# Patient Record
Sex: Male | Born: 1960 | Race: Black or African American | Hispanic: No | Marital: Single
Health system: Southern US, Community
[De-identification: ages and names within clinical notes are randomized; demographics above are authoritative.]

---

## 2004-10-02 ENCOUNTER — Inpatient Hospital Stay (HOSPITAL_COMMUNITY): Admission: EM | Admit: 2004-10-02 | Discharge: 2004-10-09 | Payer: Self-pay | Admitting: Emergency Medicine

## 2004-10-02 ENCOUNTER — Ambulatory Visit: Payer: Self-pay | Admitting: Internal Medicine

## 2004-10-05 ENCOUNTER — Ambulatory Visit: Payer: Self-pay | Admitting: Oncology

## 2007-01-25 IMAGING — CR DG ABD PORTABLE 1V
1 series · 1 of 1 positions shown · non-contrast
Comparison: none

CLINICAL DATA: Sickle cell disease.  
 PORTABLE ABDOMEN - 1 VIEW:
 Single view of the abdomen shows distention of small bowel loops measuring up to 4.2 cm in the left upper quadrant.  Stool and gas are seen in the right colon.

[view not recorded]
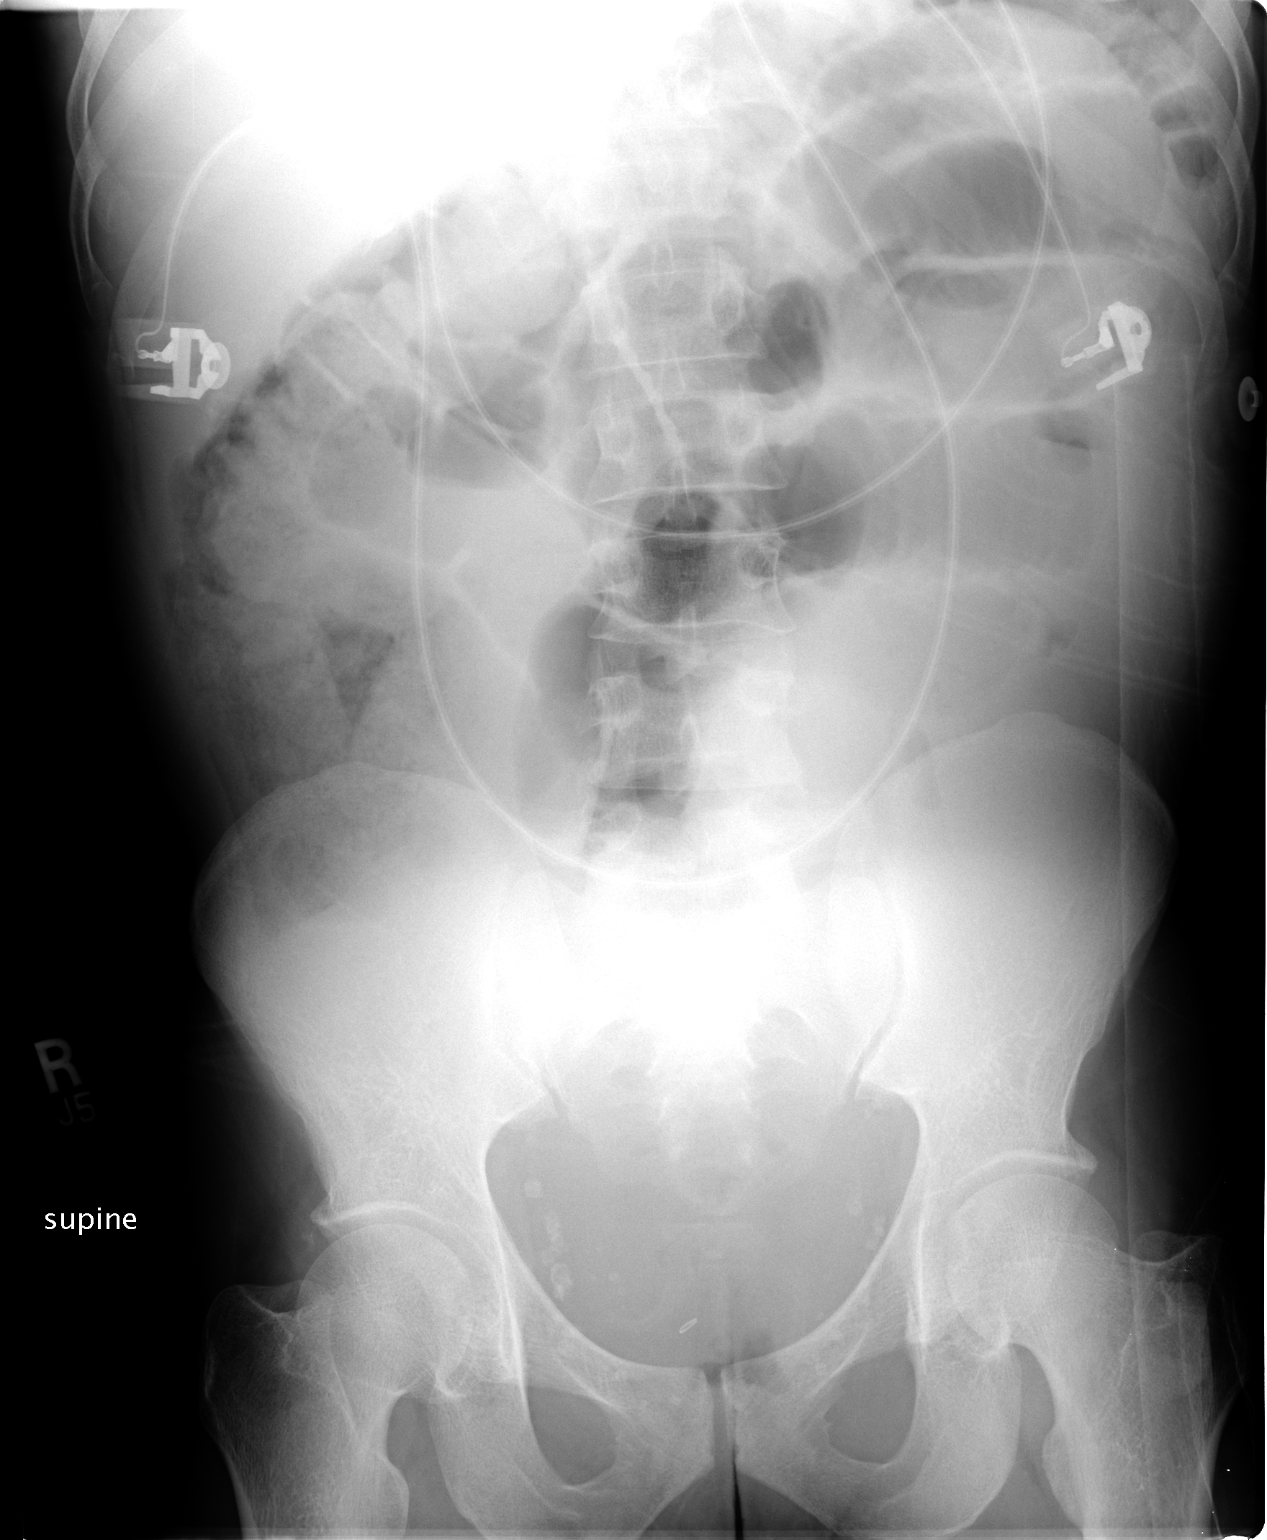

[1 of 1 positions shown; findings below may reference images not displayed]

IMPRESSION: Gaseous distention of small bowel with stool and gas seen in the colon.  Partial small bowel obstruction not excluded.

## 2007-01-26 IMAGING — CR DG CHEST 2V
2 series · 2 of 2 positions shown · non-contrast
Comparison: 10/04/2004.

CLINICAL DATA: Shortness of breath. Sickle cell disease.

CHEST - 2 VIEW

[w chest pa]
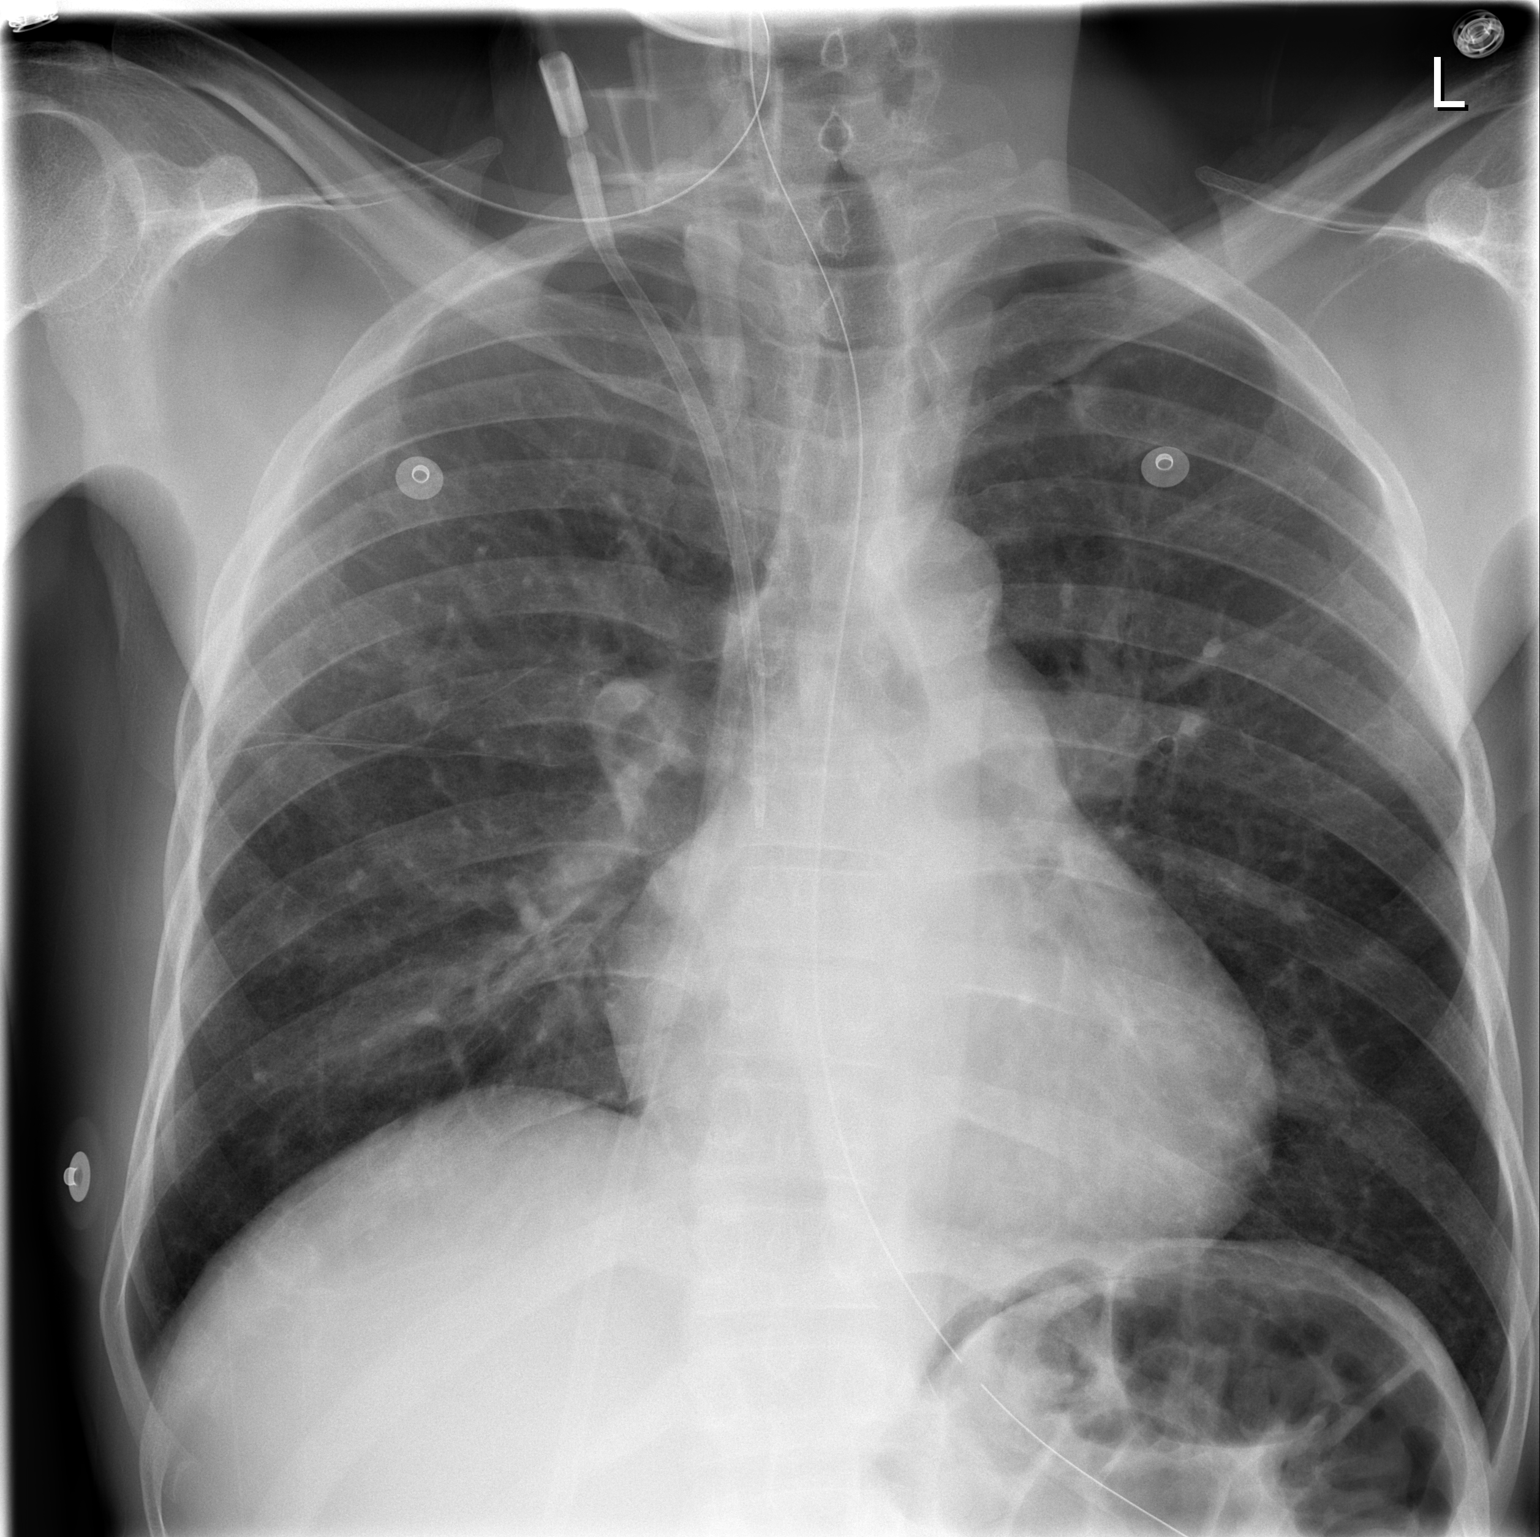

[w chest lat]
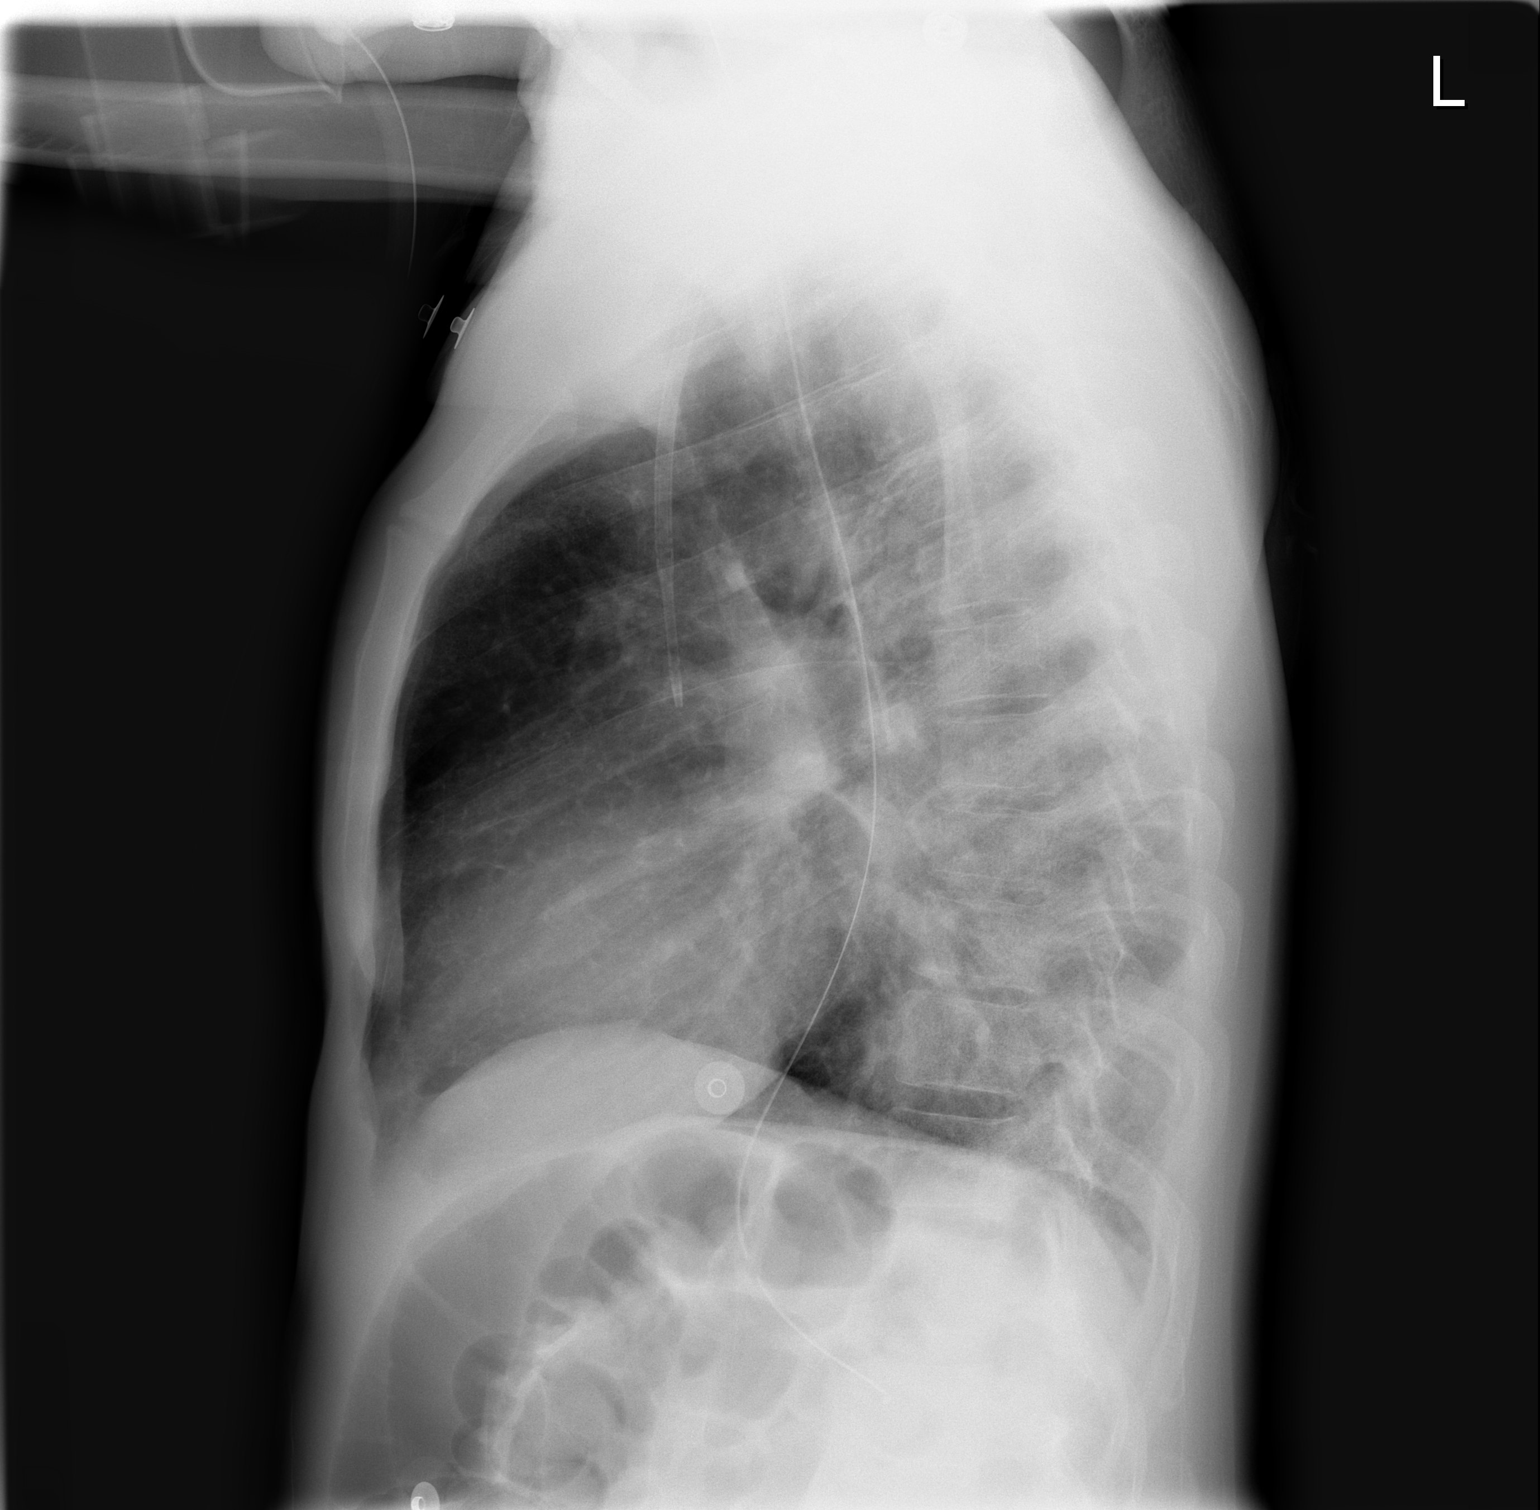

[2 of 2 positions shown; findings below may reference images not displayed]

FINDINGS: Interval right jugular catheter with its tip in the inferior aspect
of the superior vena cava, near the cavoatrial junction. No pneumothorax.
Interval nasogastric tube with its tip in the proximal stomach. Improved
inspiration with resolved bilateral air space opacity. The interstitial markings
remain mildly prominent with mild diffuse peribronchial thickening. Borderline
enlarged cardiac silhouette. Minimal scoliosis.

IMPRESSION

1. Improved inspiration with resolved bilateral atelectasis.
2. Stable minimal chronic bronchitic changes and minimal chronic interstitial
lung disease.
3. Stable borderline cardiomegaly.

## 2017-11-18 ENCOUNTER — Emergency Department (HOSPITAL_COMMUNITY)
Admission: EM | Admit: 2017-11-18 | Discharge: 2017-11-18 | Disposition: A | Discharge status: Home / Self Care | Payer: BLUE CROSS/BLUE SHIELD | Attending: Emergency Medicine | Admitting: Emergency Medicine

## 2017-11-18 ENCOUNTER — Other Ambulatory Visit: Payer: Self-pay

## 2017-11-18 DIAGNOSIS — D5700 Hb-SS disease with crisis, unspecified: Secondary | ICD-10-CM

## 2017-11-18 DIAGNOSIS — D57219 Sickle-cell/Hb-C disease with crisis, unspecified: Secondary | ICD-10-CM | POA: Diagnosis present

## 2017-11-18 LAB — COMPREHENSIVE METABOLIC PANEL
ALBUMIN: 3.8 g/dL (ref 3.5–5.0)
ALT: 27 U/L (ref 0–44)
ANION GAP: 5 (ref 5–15)
AST: 41 U/L (ref 15–41)
Alkaline Phosphatase: 68 U/L (ref 38–126)
BUN: 7 mg/dL (ref 6–20)
CHLORIDE: 112 mmol/L — AB (ref 98–111)
CO2: 22 mmol/L (ref 22–32)
Calcium: 9.2 mg/dL (ref 8.9–10.3)
Creatinine, Ser: 0.89 mg/dL (ref 0.61–1.24)
GFR calc Af Amer: >60 mL/min (ref >60)
GFR calc non Af Amer: >60 mL/min (ref >60)
GLUCOSE: 102 mg/dL — AB (ref 70–99)
POTASSIUM: 3.9 mmol/L (ref 3.5–5.1)
SODIUM: 139 mmol/L (ref 135–145)
Total Bilirubin: 1.3 mg/dL — ABNORMAL HIGH (ref 0.3–1.2)
Total Protein: 7.9 g/dL (ref 6.5–8.1)

## 2017-11-18 LAB — RETICULOCYTES
IMMATURE RETIC FRACT: 26.7 % — AB (ref 2.3–15.9)
RBC.: 2.65 MIL/uL — ABNORMAL LOW (ref 4.22–5.81)
RETIC COUNT ABSOLUTE: 257.1 10*3/uL — AB (ref 19.0–186.0)
Retic Ct Pct: 9.7 % — ABNORMAL HIGH (ref 0.4–3.1)

## 2017-11-18 LAB — CBC WITH DIFFERENTIAL/PLATELET
ABS IMMATURE GRANULOCYTES: 0.14 10*3/uL — AB (ref 0.00–0.07)
Basophils Absolute: 0 10*3/uL (ref 0.0–0.1)
Basophils Relative: 0 %
Eosinophils Absolute: 0 10*3/uL (ref 0.0–0.5)
Eosinophils Relative: 0 %
HCT: 26.4 % — ABNORMAL LOW (ref 39.0–52.0)
HEMOGLOBIN: 9.3 g/dL — AB (ref 13.0–17.0)
IMMATURE GRANULOCYTES: 2 %
LYMPHS ABS: 2.7 10*3/uL (ref 0.7–4.0)
LYMPHS PCT: 31 %
MCH: 35.1 pg — ABNORMAL HIGH (ref 26.0–34.0)
MCHC: 35.2 g/dL (ref 30.0–36.0)
MCV: 99.6 fL (ref 80.0–100.0)
MONO ABS: 0.8 10*3/uL (ref 0.1–1.0)
MONOS PCT: 9 %
NEUTROS ABS: 5.1 10*3/uL (ref 1.7–7.7)
NEUTROS PCT: 58 %
PLATELETS: 282 10*3/uL (ref 150–400)
RBC: 2.65 MIL/uL — ABNORMAL LOW (ref 4.22–5.81)
RDW: 18.2 % — ABNORMAL HIGH (ref 11.5–15.5)
WBC: 8.8 10*3/uL (ref 4.0–10.5)
nRBC: 0.8 % — ABNORMAL HIGH (ref 0.0–0.2)

## 2017-11-18 MED ORDER — HYDROMORPHONE HCL 1 MG/ML IJ SOLN
1.0000 mg | INTRAMUSCULAR | Status: AC
  Filled 2017-11-18: qty 1

## 2017-11-18 MED ORDER — HYDROMORPHONE HCL 1 MG/ML IJ SOLN
1.0000 mg | INTRAMUSCULAR | Status: AC
  Administered 2017-11-18 (×2): 1 mg via INTRAVENOUS

## 2017-11-18 MED ORDER — HYDROMORPHONE HCL 1 MG/ML IJ SOLN: 1.0000 mg | INTRAMUSCULAR | Status: AC

## 2017-11-18 MED ORDER — METHOCARBAMOL 500 MG PO TABS: 500.0000 mg | ORAL_TABLET | Freq: Three times a day (TID) | ORAL | 0 refills | Status: AC | PRN

## 2017-11-18 MED ORDER — KETOROLAC TROMETHAMINE 30 MG/ML IJ SOLN
30.0000 mg | INTRAMUSCULAR | Status: AC
  Administered 2017-11-18: 30 mg via INTRAVENOUS
  Filled 2017-11-18: qty 1

## 2017-11-18 MED ORDER — HYDROMORPHONE HCL 1 MG/ML IJ SOLN: 0.5000 mg | INTRAMUSCULAR | Status: AC

## 2017-11-18 MED ORDER — HYDROMORPHONE HCL 1 MG/ML IJ SOLN
0.5000 mg | INTRAMUSCULAR | Status: AC
  Filled 2017-11-18: qty 1

## 2017-11-18 NOTE — ED Notes (Signed)
D/c reviewed with patient-no further questions at this time 

## 2017-11-18 NOTE — ED Provider Notes (Signed)
  Physical Exam  BP 126/70   Pulse 64   Temp 98.1 F (36.7 C) (Oral)   Resp 12   Ht 5\' 6"  (1.676 m)   Wt 65.8 kg   SpO2 94%   BMI 23.40 kg/m   Physical Exam  ED Course/Procedures     Procedures  MDM  Patient with pain. History of sickle cell. compaining of left shoulder pain for me. Mild tenderness. Labs reassuring. Feels better. Will d/c.       Benjiman Core, MD 11/18/17 [phone]

## 2017-11-18 NOTE — ED Triage Notes (Signed)
Patient c/o back pain that led to bilateral leg pain. Now having left arm pain. Denies CP and SOB.

## 2017-11-18 NOTE — ED Notes (Signed)
Checked on pt, made him aware he has additional pain meds if needed, pt states he feels ok for now. Advised him to let us know if he felt he needed something else. Pt agreeable to plan, resting comfortably in stretcher at this time

## 2017-11-18 NOTE — ED Provider Notes (Signed)
[facility] EMERGENCY DEPARTMENT Provider Note   CSN: [id number] Arrival date & time: 11/18/17  0530     History   Chief Complaint Chief Complaint  Patient presents with  . Leg Pain    sickle cell    HPI Dustin Osborne is a 57 y.o. male.  The history is provided by the patient.  He has history of sickle cell disease and comes in with what he believes is a sickle cell painful crisis.  He woke up at about 3 AM with pain in both hips and going down both legs.  He rates pain at 8/10.  He has not taken anything for pain, but states that at home, he usually uses ibuprofen for pain.  He denies fever, chills, sweats.  He denies cough.  He denies nausea, vomiting, diarrhea.  He denies urinary urgency, frequency, tenesmus, dysuria.  No past medical history on file.  There are no active problems to display for this patient.   ** The histories are not reviewed yet. Please review them in the "History" navigator section and refresh this SmartLink.      Home Medications    Prior to Admission medications   Not on File    Family History No family history on file.  Social History Social History   Tobacco Use  . Smoking status: Not on file  Substance Use Topics  . Alcohol use: Not on file  . Drug use: Not on file     Allergies   Percocet [oxycodone-acetaminophen]   Review of Systems Review of Systems  All other systems reviewed and are negative.    Physical Exam Updated Vital Signs BP 135/87 (BP Location: Right Arm)   Pulse 92   Temp 98.1 F (36.7 C) (Oral)   Resp 18   Ht 5\' 6"  (1.676 m)   Wt 65.8 kg   SpO2 96%   BMI 23.40 kg/m   Physical Exam  Nursing note and vitals reviewed.  57 year old male, resting comfortably and in no acute distress. Vital signs are normal. Oxygen saturation is 96%, which is normal. Head is normocephalic and atraumatic. PERRLA, EOMI. Oropharynx is clear. Neck is nontender and supple without adenopathy or  JVD. Back is nontender and there is no CVA tenderness. Lungs are clear without rales, wheezes, or rhonchi. Chest is nontender. Heart has regular rate and rhythm without murmur. Abdomen is soft, flat, nontender without masses or hepatosplenomegaly and peristalsis is normoactive. Extremities have no cyanosis or edema, full range of motion is present. Skin is warm and dry without rash. Neurologic: Mental status is normal, cranial nerves are intact, there are no motor or sensory deficits.  ED Treatments / Results  Labs (all labs ordered are listed, but only abnormal results are displayed) Labs Reviewed  COMPREHENSIVE METABOLIC PANEL - Abnormal; Notable for the following components:      Result Value   Chloride 112 (*)    Glucose, Bld 102 (*)    Total Bilirubin 1.3 (*)    All other components within normal limits  CBC WITH DIFFERENTIAL/PLATELET - Abnormal; Notable for the following components:   RBC 2.65 (*)    Hemoglobin 9.3 (*)    HCT 26.4 (*)    MCH 35.1 (*)    RDW 18.2 (*)    nRBC 0.8 (*)    Abs Immature Granulocytes 0.14 (*)    All other components within normal limits  RETICULOCYTES - Abnormal; Notable for the following components:   Retic Ct  Pct 9.7 (*)    RBC. 2.65 (*)    Retic Count, Absolute 257.1 (*)    Immature Retic Fract 26.7 (*)    All other components within normal limits  URINALYSIS, ROUTINE W REFLEX MICROSCOPIC   Procedures Procedures   Medications Ordered in ED Medications  HYDROmorphone (DILAUDID) injection 0.5 mg (has no administration in time range)    Or  HYDROmorphone (DILAUDID) injection 0.5 mg (has no administration in time range)  HYDROmorphone (DILAUDID) injection 1 mg (has no administration in time range)    Or  HYDROmorphone (DILAUDID) injection 1 mg (has no administration in time range)  HYDROmorphone (DILAUDID) injection 1 mg (has no administration in time range)    Or  HYDROmorphone (DILAUDID) injection 1 mg (has no administration in time  range)  HYDROmorphone (DILAUDID) injection 1 mg (has no administration in time range)    Or  HYDROmorphone (DILAUDID) injection 1 mg (has no administration in time range)  ketorolac (TORADOL) 30 MG/ML injection 30 mg (30 mg Intravenous Given 11/18/17 0619)     Initial Impression / Assessment and Plan / ED Course  I have reviewed the triage vital signs and the nursing notes.  Pertinent labs & imaging results that were available during my care of the patient were reviewed by me and considered in my medical decision making (see chart for details).  Sickle cell crisis.  Patient states that he rarely, if ever, uses opioids.  He usually treats attacks like this with ibuprofen, but is traveling and did not have any with him.  He is placed on the sickle cell protocol.  7:30 AM He has achieved modest pain relief, but will need additional hydromorphone injections.  Screening labs are unremarkable.  Hemoglobin is 9.3, no prior values to compare.  Reticulocyte count is appropriately elevated.  Case is signed out to Dr. Rubin Payor.  Final Clinical Impressions(s) / ED Diagnoses   Final diagnoses:  Sickle cell pain crisis [place])    ED Discharge Orders    None       Dione Booze, MD 11/18/17 [phone]
